# Patient Record
Sex: Female | Born: 1970 | Race: White | Hispanic: No | Marital: Married | Smoking: Former smoker
Health system: Southern US, Community
[De-identification: ages and names within clinical notes are randomized; demographics above are authoritative.]

## PROBLEM LIST (undated history)

## (undated) DIAGNOSIS — E079 Disorder of thyroid, unspecified: Secondary | ICD-10-CM

## (undated) DIAGNOSIS — J189 Pneumonia, unspecified organism: Secondary | ICD-10-CM

## (undated) DIAGNOSIS — L509 Urticaria, unspecified: Secondary | ICD-10-CM

## (undated) DIAGNOSIS — I272 Pulmonary hypertension, unspecified: Secondary | ICD-10-CM

## (undated) HISTORY — DX: Pulmonary hypertension, unspecified: I27.20

## (undated) HISTORY — DX: Pneumonia, unspecified organism: J18.9

## (undated) HISTORY — DX: Disorder of thyroid, unspecified: E07.9

## (undated) HISTORY — DX: Urticaria, unspecified: L50.9

## (undated) HISTORY — PX: TONSILLECTOMY: SUR1361

## (undated) HISTORY — PX: ABDOMINAL HYSTERECTOMY: SHX81

---

## 2014-01-18 ENCOUNTER — Ambulatory Visit (INDEPENDENT_AMBULATORY_CARE_PROVIDER_SITE_OTHER): Payer: BC Managed Care – PPO

## 2014-01-18 VITALS — BP 135/84 | HR 96 | Resp 12

## 2014-01-18 DIAGNOSIS — S9031XA Contusion of right foot, initial encounter: Secondary | ICD-10-CM

## 2014-01-18 DIAGNOSIS — R52 Pain, unspecified: Secondary | ICD-10-CM

## 2014-01-18 DIAGNOSIS — G5791 Unspecified mononeuropathy of right lower limb: Secondary | ICD-10-CM

## 2014-01-18 MED ORDER — MELOXICAM 15 MG PO TABS: 15.0000 mg | ORAL_TABLET | Freq: Every day | ORAL | Status: DC

## 2014-01-18 NOTE — Patient Instructions (Signed)
ICE INSTRUCTIONS  Apply ice or cold pack to the affected area at least 3 times a day for 10-15 minutes each time.  You should also use ice after prolonged activity or vigorous exercise.  Do not apply ice longer than 20 minutes at one time.  Always keep a cloth between your skin and the ice pack to prevent burns.  Being consistent and following these instructions will help control your symptoms.  We suggest you purchase a gel ice pack because they are reusable and do bit leak.  Some of them are designed to wrap around the area.  Use the method that works best for you.  Here are some other suggestions for icing.   Use a frozen bag of peas or corn-inexpensive and molds well to your body, usually stays frozen for 10 to 20 minutes.  Wet a towel with cold water and squeeze out the excess until it's damp.  Place in a bag in the freezer for 20 minutes. Then remove and use.  Alternate applying a hot compress such as a warm hot moist washcloth for 10 minutes, then apply ice pack for 10 minutes. Repeat this alternating hot and cold therapy 2 or 3 times, at least 2 or 3 times every day. Maintain Darco or loosefitting shoe for at least another week. Followup if no improvement within the next 2-4 weeks

## 2014-01-18 NOTE — Progress Notes (Signed)
   Subjective:    Patient ID: Stephan Ministermy Bristol, female    DOB: 12/30/1970, 43 y.o.   MRN: [id number]  HPI PT STATED INJURED RT FOOT 1 WEEK AGO AND STILL PAINFUL. FOOT IS NOT GETTING WORSE BUT FELT HAVE SWELLING, BURNING, AND NUMBNESS FEELING. THE FOOT GET AGGRAVATED BY WALKING. TRIED WEARING DARCO SHOES.   Review of Systems  HENT: Positive for hearing loss.   All other systems reviewed and are negative.      Objective:   Physical Exam 43 year old white female well-developed well-nourished oriented x3 indication dropped a piece of countertop approximately 20 pounds weight on the top of her foot just around the metatarsophalangeal joint area great toe and across the entire forefoot. It are extremely initially however continues to be painful describes a sharp tissue sensations burning sensation the bottom of the football foot under the metatarsal area first also some tenderness along the dorsal foot from second to fourth metatarsal neck areas. There is no edema no ecchymosis noted the lunate and worse initially although this not significant at this time. No open wounds no ulcers no lacerations noted  Lower extremity objective findings reveal vascular status to be intact pedal pulses palpable DP and PT +2/4 capillary refill time 3 seconds epicritic and proprioceptive sensations intact patient may have some hyperesthesia on palpation and percussion over the dorsum of the foot and some tenderness in the plantar foot first MTP area although no acute edema no induration the joints is identified x-rays reveal no fracture no other osseous abnormality no cyst or tumors no spurs are identified. From a biomechanical standpoint motor functions are intact and symmetric bilateral has good dorsal flexion plantar flexion inversion eversion digital motions are intact and symmetric some tenderness on the dorsal flexion the hallux is noted. Remainder of exam unremarkable rectus digits otherwise identified rectus foot  noted       Assessment & Plan:  Assessment this time contusion dorsum of the foot with possibly subsequent neuropraxia or neuritis. The toes appear to be intact although cannot rule out some mild tendinopathy however in general more likely with I nerve type symptomology posse swelling and nerve compression her contusion of the nerve. Plan at this time [facility] given remanded alternating hot and cold therapy is making Darco shoe. The week and then resume other shoes and activities. If not resolved within the next 3 or 4 weeks followup for reevaluation may be consider other noninvasive studies to assess soft tissue injury or trauma that is not visible on plain x-ray. Again hot and cold therapy as recommended and MOBIC 15 mg once daily next  Alvan Dameichard Tamora Huneke DPM

## 2017-09-19 ENCOUNTER — Ambulatory Visit (INDEPENDENT_AMBULATORY_CARE_PROVIDER_SITE_OTHER): Payer: BLUE CROSS/BLUE SHIELD | Admitting: Pulmonary Disease

## 2017-09-19 ENCOUNTER — Encounter (INDEPENDENT_AMBULATORY_CARE_PROVIDER_SITE_OTHER): Payer: Self-pay

## 2017-09-19 ENCOUNTER — Other Ambulatory Visit: Payer: Self-pay | Admitting: Pulmonary Disease

## 2017-09-19 ENCOUNTER — Encounter: Payer: Self-pay | Admitting: Pulmonary Disease

## 2017-09-19 VITALS — BP 122/76 | HR 93 | Ht 66.0 in | Wt 213.8 lb

## 2017-09-19 DIAGNOSIS — J189 Pneumonia, unspecified organism: Secondary | ICD-10-CM | POA: Diagnosis not present

## 2017-09-19 DIAGNOSIS — R0600 Dyspnea, unspecified: Secondary | ICD-10-CM

## 2017-09-19 DIAGNOSIS — J181 Lobar pneumonia, unspecified organism: Secondary | ICD-10-CM

## 2017-09-19 NOTE — Progress Notes (Deleted)
   Subjective:    Patient ID: Kaitlyn Walker, female    DOB: 12/11/1970, 47 y.o.   MRN: [id number]  HPI    Review of Systems  Constitutional: Positive for fatigue.  HENT: Positive for congestion.   Respiratory: Positive for cough, chest tightness and shortness of breath.   Cardiovascular: Positive for chest pain.       Objective:   Physical Exam        Assessment & Plan:

## 2017-09-19 NOTE — Progress Notes (Signed)
Synopsis: Referred in June 2019 for an abnormal CT chest after a bad case of pneumonia.  Subjective:   PATIENT ID: Kaitlyn MinisterAmy Swiech GENDER: female DOB: 04/27/1970, MRN: [id number]   HPI  Chief Complaint  Patient presents with  . pulmonary consult    referred by Loann QuillNatalie Smith, NP for pneumonia     Zawadi was diagnosed with influenza and strep throat based on throat and nose swabs on April 28.  She says taht a few days later she had worsening chest pain and tenderndess.  She had a hard time breathing at that time and she was sent to [facility] and was hospitalized for 5 days.  She says that the repeat flu test (performed twice at the hospital) were negative.  She received antibiotics and was discharged.  She says that she made very little improvement and she has been seen frequently over the last few weeks and has had repeated CXR's and CT scanning performed by her PCP.  No bronchiits in the last few years.    She now feels tired and is exhausted with showring and getting dressed.  She is still coughing up yellow mucus from time to time.  No fever or chills.    She says that she has had pneumonia off and on for most of her life.  This has occurred every 5-10 years for her entire life.  The last case was 16 years ago.  No childhood asthma.  Her daughters have asthma.  She has six children.  She used to smoke.  She says that she smoked off and on for 20 years, but would quit for extended times like when pregnant.  She says that she smoked no more than 1/4 pack per day.  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  She says there is not too much dust or chemicals.   She says that while hospitalized she had the sudden onset of redness and swelling of her right lower extremity about 3 days during the hospitalization.  She does not recall taking blood thinners.  She said she did not have an ultrasound to evaluate.  Past Medical History:  Diagnosis Date  . Thyroid disease      History reviewed. No  pertinent family history.   Social History   Socioeconomic History  . Marital status: Married    Spouse name: Not on file  . Number of children: Not on file  . Years of education: Not on file  . Highest education level: Not on file  Occupational History  . Not on file  Social Needs  . Financial resource strain: Not on file  . Food insecurity:    Worry: Not on file    Inability: Not on file  . Transportation needs:    Medical: Not on file    Non-medical: Not on file  Tobacco Use  . Smoking status: Former Smoker    Packs/day: 0.00    Years: 20.00    Pack years: 0.00    Types: Cigarettes    Last attempt to quit: 2009    Years since quitting: 10.4  . Smokeless tobacco: Never Used  Substance and Sexual Activity  . Alcohol use: No  . Drug use: No  . Sexual activity: Not on file  Lifestyle  . Physical activity:    Days per week: Not on file    Minutes per session: Not on file  . Stress: Not on file  Relationships  . Social connections:    Talks  on phone: Not on file    Gets together: Not on file    Attends religious service: Not on file    Active member of club or organization: Not on file    Attends meetings of clubs or organizations: Not on file    Relationship status: Not on file  . Intimate partner violence:    Fear of current or ex partner: Not on file    Emotionally abused: Not on file    Physically abused: Not on file    Forced sexual activity: Not on file  Other Topics Concern  . Not on file  Social History Narrative  . Not on file     Allergies  Allergen Reactions  . Codeine      Outpatient Medications Prior to Visit  Medication Sig Dispense Refill  . cyanocobalamin 1000 MCG tablet Take 1,000 mcg by mouth daily.    Marland Kitchen levothyroxine (SYNTHROID, LEVOTHROID) 200 MCG tablet Take 150 mcg by mouth daily before breakfast.     . loratadine (CLARITIN) 10 MG tablet Take 10 mg by mouth daily.    . meloxicam (MOBIC) 15 MG tablet Take 1 tablet (15 mg total) by  mouth daily. 30 tablet 1  . oxyCODONE (OXY IR/ROXICODONE) 5 MG immediate release tablet      No facility-administered medications prior to visit.     Review of Systems  Constitutional: Positive for malaise/fatigue. Negative for chills, fever and weight loss.  HENT: Negative for congestion, nosebleeds, sinus pain and sore throat.   Eyes: Negative for photophobia, pain and discharge.  Respiratory: Positive for cough, sputum production and shortness of breath. Negative for hemoptysis and wheezing.   Cardiovascular: Negative for chest pain, palpitations, orthopnea and leg swelling.  Gastrointestinal: Negative for abdominal pain, constipation, diarrhea, nausea and vomiting.  Genitourinary: Negative for dysuria, frequency, hematuria and urgency.  Musculoskeletal: Negative for back pain, joint pain, myalgias and neck pain.  Skin: Negative for itching and rash.  Neurological: Negative for tingling, tremors, sensory change, speech change, focal weakness, seizures, weakness and headaches.  Psychiatric/Behavioral: Negative for memory loss, substance abuse and suicidal ideas. The patient is not nervous/anxious.       Objective:  Physical Exam   Vitals:   09/19/17 1503  BP: 122/76  Pulse: 93  SpO2: 97%  Weight: 213 lb 12.8 oz (97 kg)  Height: 5\' 6"  (1.676 m)   RA  Gen: well appearing, no acute distress HENT: NCAT, OP clear, neck supple without masses Eyes: PERRL, EOMi Lymph: no cervical lymphadenopathy PULM: CTA B CV: RRR, no mgr, no JVD GI: BS+, soft, nontender, no hsm Derm: no rash or skin breakdown MSK: normal bulk and tone Neuro: A&Ox4, CN II-XII intact, strength 5/5 in all 4 extremities Psyche: normal mood and affect   CBC No results found for: WBC, RBC, HGB, HCT, PLT, MCV, MCH, MCHC, RDW, LYMPHSABS, MONOABS, EOSABS, BASOSABS   Chest imaging: Aug 19, 2017 chest x-ray images independently reviewed showing significant consolidation in the left lung, specifically left lower  lobe, some patchy consolidation in the right Sep 13, 2017 CT chest images independently reviewed showing patchy consolidation and groundglass in the upper lobes bilaterally left greater than right, some mucus plugging in the left lower lobe PFT:  Labs:  Path:  Echo:  Heart Catheterization:       Assessment & Plan:   Dyspnea, unspecified type - Plan: ECHOCARDIOGRAM COMPLETE, CBC with Differential/Platelet, IgG, IgM, IgA, IgG Subclasses, C3 and C4  Discussion: Ms. Gainey comes my clinic  today for evaluation of a episode of severe community acquired pneumonia which has been slowly resolving.  Today she describes symptoms which are completely in keeping with the natural history of pneumonia and that she still has some fatigue, shortness of breath and cough with mucus production.  Given the severity of consolidation on the chest x-ray I am not surprised that she still feels this way.  I told her today that if she were to have worsening fever or worsening shortness of breath that would be a problem.  I am a bit concerned about her ongoing shortness of breath however considering the fact that she says that she had the onset of redness and swelling of her foot while hospitalized and she says she does not recall receiving any shots or other medicines for blood thinning.  This concerns me about the possibility of pulmonary embolism.  I think the best approach at this point is to check an echocardiogram to look for evidence of pulmonary hypertension.  In addition, she describes several episodes of pneumonia over her life.  I do not see evidence of structural lung disease on the CT scan of her chest and I do not think there is any other lung problem.  However, I do question whether or not she could have some sort of immunodeficiency.  Plan:  For recurrent episodes of pneumonia: We will check blood work today to look for evidence of a problem with your immune system: Complete blood count with  differential, serum immunoglobulin levels, serum IgG sub-panel levels, complement levels.  For your recent severe community-acquired pneumonia: The symptoms you are describing are completely in keeping with the natural progression of pneumonia.  However, it would not be normal for you to have a new fever or worsening shortness of breath.  If you have that problem he should call us right away. We need to make sure that the pneumonia gets better so I when she to come back in 3 to 4 weeks to have another chest x-ray. I anticipate that it will be difficult for you to return to work for another 4 to 6 weeks however so were happy to fill out paperwork for your employer.  For the ongoing shortness of breath: Because she described leg pain and swelling during the hospitalization I am going to check an echocardiogram to make sure there is no evidence of a condition called pulmonary hypertension which can develop after blood clots.  We will see you back in 3 to 4 weeks with a chest x-ray.   Current Outpatient Medications:  .  cyanocobalamin 1000 MCG tablet, Take 1,000 mcg by mouth daily., Disp: , Rfl:  .  levothyroxine (SYNTHROID, LEVOTHROID) 200 MCG tablet, Take 150 mcg by mouth daily before breakfast. , Disp: , Rfl:

## 2017-09-19 NOTE — Patient Instructions (Signed)
For recurrent episodes of pneumonia: We will check blood work today to look for evidence of a problem with your immune system: Complete blood count with differential, serum immunoglobulin levels, serum IgG sub-panel levels, complement levels.  For your recent severe community-acquired pneumonia: The symptoms you are describing are completely in keeping with the natural progression of pneumonia.  However, it would not be normal for you to have a new fever or worsening shortness of breath.  If you have that problem he should call us right away. We need to make sure that the pneumonia gets better so I when she to come back in 3 to 4 weeks to have another chest x-ray. I anticipate that it will be difficult for you to return to work for another 4 to 6 weeks however so were happy to fill out paperwork for your employer.  For the ongoing shortness of breath: Because she described leg pain and swelling during the hospitalization I am going to check an echocardiogram to make sure there is no evidence of a condition called pulmonary hypertension which can develop after blood clots.  We will see you back in 3 to 4 weeks with a chest x-ray.

## 2017-09-20 LAB — CBC WITH DIFFERENTIAL/PLATELET
Basophils Absolute: 0 10*3/uL (ref 0.0–0.2)
Basos: 0 %
EOS (ABSOLUTE): 0.3 10*3/uL (ref 0.0–0.4)
Eos: 3 %
Hematocrit: 38.6 % (ref 34.0–46.6)
Hemoglobin: 12.6 g/dL (ref 11.1–15.9)
Immature Grans (Abs): 0 10*3/uL (ref 0.0–0.1)
Immature Granulocytes: 0 %
LYMPHS ABS: 2.3 10*3/uL (ref 0.7–3.1)
LYMPHS: 22 %
MCH: 28 pg (ref 26.6–33.0)
MCHC: 32.6 g/dL (ref 31.5–35.7)
MCV: 86 fL (ref 79–97)
Monocytes Absolute: 0.9 10*3/uL (ref 0.1–0.9)
Monocytes: 9 %
Neutrophils Absolute: 6.7 10*3/uL (ref 1.4–7.0)
Neutrophils: 66 %
PLATELETS: 240 10*3/uL (ref 150–450)
RBC: 4.5 x10E6/uL (ref 3.77–5.28)
RDW: 15.5 % — ABNORMAL HIGH (ref 12.3–15.4)
WBC: 10.3 10*3/uL (ref 3.4–10.8)

## 2017-09-20 LAB — C3 AND C4
Complement C3, Serum: 165 mg/dL (ref 82–167)
Complement C4, Serum: 29 mg/dL (ref 14–44)

## 2017-09-20 LAB — IGG: IGG (IMMUNOGLOBIN G), SERUM: 1015 mg/dL (ref 700–1600)

## 2017-09-20 LAB — IGM: IgM (Immunoglobulin M), Srm: 63 mg/dL (ref 26–217)

## 2017-09-20 LAB — SPECIMEN STATUS REPORT

## 2017-09-20 LAB — IGA: IGA/IMMUNOGLOBULIN A, SERUM: 254 mg/dL (ref 87–352)

## 2017-09-20 NOTE — Progress Notes (Signed)
LMTCB x1 on preferred phone number listed for patient.  

## 2017-09-21 ENCOUNTER — Telehealth: Payer: Self-pay | Admitting: Pulmonary Disease

## 2017-09-21 LAB — IGG 1, 2, 3, AND 4
IGG 4: 11 mg/dL (ref 2–96)
IgG (Immunoglobin G), Serum: 981 mg/dL (ref 700–1600)
IgG, Subclass 1: 549 mg/dL (ref 248–810)
IgG, Subclass 2: 302 mg/dL (ref 130–555)
IgG, Subclass 3: 82 mg/dL (ref 15–102)

## 2017-09-21 LAB — SPECIMEN STATUS REPORT

## 2017-09-21 NOTE — Telephone Encounter (Signed)
Advised pt of results. Pt understood and nothing further is needed.     Notes recorded by Lupita LeashMcQuaid, Douglas B, MD on 09/20/2017 at 11:43 AM EDT BJ, Please let the patient know this was OK Thanks, B

## 2017-09-22 ENCOUNTER — Telehealth: Payer: Self-pay | Admitting: Pulmonary Disease

## 2017-09-22 NOTE — Telephone Encounter (Signed)
Forms have been handed to [employer].   Will route to Merrilee SeashoreBettie Jo to f/u on.

## 2017-09-23 ENCOUNTER — Ambulatory Visit (HOSPITAL_COMMUNITY): Payer: BLUE CROSS/BLUE SHIELD | Attending: Internal Medicine

## 2017-09-23 ENCOUNTER — Other Ambulatory Visit: Payer: Self-pay

## 2017-09-23 ENCOUNTER — Other Ambulatory Visit (HOSPITAL_COMMUNITY): Payer: BLUE CROSS/BLUE SHIELD

## 2017-09-23 DIAGNOSIS — J189 Pneumonia, unspecified organism: Secondary | ICD-10-CM | POA: Diagnosis not present

## 2017-09-23 DIAGNOSIS — R0600 Dyspnea, unspecified: Secondary | ICD-10-CM | POA: Diagnosis not present

## 2017-09-23 DIAGNOSIS — Z87891 Personal history of nicotine dependence: Secondary | ICD-10-CM | POA: Diagnosis not present

## 2017-09-23 DIAGNOSIS — I272 Pulmonary hypertension, unspecified: Secondary | ICD-10-CM | POA: Diagnosis not present

## 2017-09-23 NOTE — Telephone Encounter (Signed)
Dr. Kendrick FriesMcQuaid has complete forms and it has been faxed. Nothing further needed.

## 2017-09-27 ENCOUNTER — Other Ambulatory Visit: Payer: Self-pay | Admitting: Pulmonary Disease

## 2017-09-27 ENCOUNTER — Ambulatory Visit (INDEPENDENT_AMBULATORY_CARE_PROVIDER_SITE_OTHER)
Admission: RE | Admit: 2017-09-27 | Discharge: 2017-09-27 | Disposition: A | Discharge status: Home / Self Care | Payer: BLUE CROSS/BLUE SHIELD | Source: Ambulatory Visit | Attending: Pulmonary Disease | Admitting: Pulmonary Disease

## 2017-09-27 DIAGNOSIS — J189 Pneumonia, unspecified organism: Secondary | ICD-10-CM

## 2017-09-27 MED ORDER — IOPAMIDOL (ISOVUE-370) INJECTION 76%
80.0000 mL | Freq: Once | INTRAVENOUS | Status: AC | PRN
  Administered 2017-09-27: 80 mL via INTRAVENOUS

## 2017-10-03 ENCOUNTER — Telehealth: Payer: Self-pay | Admitting: Pulmonary Disease

## 2017-10-03 NOTE — Telephone Encounter (Signed)
Spoke with pt. She is needing her FMLA paperwork updated. On the form Dr. Kendrick FriesMcQuaid wrote for the pt to go back to on 10/03/17. Pt states that she was told by Dr. Kendrick FriesMcQuaid that she would be out of work for 4-6 weeks. She is wanting this to be reflected on her forms. We no longer have the original forms due to them being sent to be scanned. Pt is going to have her employer fax over another copy. This going to be coming to [employer]'s attention. Will hold in triage until it's received.

## 2017-10-04 NOTE — Telephone Encounter (Signed)
Pt is calling back about FMLA paperwork [phone]

## 2017-10-04 NOTE — Telephone Encounter (Signed)
Called and spoke with patient, she wanted to know if we have received the papers from her work yet. Checked with bettie jo, checked BQ cubby and folder. Advised patient that we have not received the paperwork. She states that she will call her work tomorrow about this.

## 2017-10-05 NOTE — Telephone Encounter (Signed)
Called and spoke with pt to see if the FMLA paperwork had been sent to our office and she stated her work was supposed to be faxing our office paperwork that needs to be filled out for [employer].  Pt stated corporate could have a statement stating when pt's real estimate return back to will be and that should good enough.  Pt does have an OV scheduled tomorrow, 6[facility]20 [facility] and said if she needed to bring anything with her regarding getting the FMLA taken care of, she would.  Patrice, please advise if you have received a fax regarding FMLA paperwork on pt.

## 2017-10-05 NOTE — Telephone Encounter (Signed)
Called and spoke with patient, she states that her work told her if they did not have a statement from the FMLA today regarding her dates of return then she would not get paid. Patient stated that she was going to bring paperwork up this afternoon to be filled out by BQ to return to her work today. Advised patient that BQ was not in the office today and he would not be able to fill it out. She is wanting to know what to do next. She is aware that BQ is not in office until next Monday and even then he is in [place].   BQ please advise, thank you.

## 2017-10-05 NOTE — Telephone Encounter (Signed)
If you put it on my desk this afternoon I'll try to fill it out tonight

## 2017-10-05 NOTE — Telephone Encounter (Signed)
Pt has question's about the FML papers    [phone]

## 2017-10-06 ENCOUNTER — Encounter: Payer: Self-pay | Admitting: Adult Health

## 2017-10-06 ENCOUNTER — Ambulatory Visit (HOSPITAL_BASED_OUTPATIENT_CLINIC_OR_DEPARTMENT_OTHER)
Admission: RE | Admit: 2017-10-06 | Discharge: 2017-10-06 | Disposition: A | Discharge status: Home / Self Care | Payer: BLUE CROSS/BLUE SHIELD | Source: Ambulatory Visit | Attending: Adult Health | Admitting: Adult Health

## 2017-10-06 ENCOUNTER — Ambulatory Visit (INDEPENDENT_AMBULATORY_CARE_PROVIDER_SITE_OTHER): Payer: BLUE CROSS/BLUE SHIELD | Admitting: Adult Health

## 2017-10-06 VITALS — BP 129/80 | HR 87 | Ht 66.0 in | Wt 213.0 lb

## 2017-10-06 DIAGNOSIS — R918 Other nonspecific abnormal finding of lung field: Secondary | ICD-10-CM | POA: Insufficient documentation

## 2017-10-06 DIAGNOSIS — J189 Pneumonia, unspecified organism: Secondary | ICD-10-CM | POA: Insufficient documentation

## 2017-10-06 NOTE — Progress Notes (Signed)
@Patient  ID: Kaitlyn Walker, female    DOB: 1971-03-15, 47 y.o.   MRN: [id number]  Chief Complaint  Patient presents with  . 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   TEST  Aug 19, 2017 chest x-ray images independently reviewed showing significant consolidation in the left lung, specifically left lower lobe, some patchy consolidation in the right Sep 13, 2017 CT chest images independently reviewed showing patchy consolidation and groundglass in the upper lobes bilaterally left greater than right, some mucus plugging in the left lower lobe   10/06/2017 Follow up : PNA  Patient returns for a 2-week follow-up.  Patient was diagnosed with influenza and strep throat in April 2019 she continued to have persistent cough and congestion was found to have bilateral pneumonia.  She ended up requiring hospitalization and treatment for pneumonia.  Patient had a had lingering cough and shortness of breath. Patient was set up for an 2D echo that showed normal ejection and at 60-65%, grade 1 diastolic dysfunction, mild pulmonary artery hypertension pulmonary pressure at 30 mmHg.  Due to the mild pulmonary artery hypertension patient was set up for a CT chest angios that was negative for PE.  There was some patchy infiltrates in the upper lobe that showed improvement with decreasing density. Labs showed normal CBC, IgA, IgM and IgG were normal Last visit patient is feeling some better, still weak with low energy . Cough is decreasing .  Chest x-ray today shows ongoing interval  improvement in the bilateral upper lobe airspace densities.  There is only a subtle residual opacity now seen on the left.  Denies any chest pain, orthopnea, fever. Spirometry today shows normal lung function.  Allergies  Allergen Reactions  . Codeine      There is no immunization history on file for this patient.  Past Medical History:  Diagnosis Date  . Thyroid disease     Tobacco History: Social History   Tobacco Use  Smoking Status Former Smoker  . Packs/day: 0.00  . Years: 20.00  . Pack years: 0.00  . Types: Cigarettes  . Last attempt to quit: 2009  . Years since quitting: 10.4  Smokeless Tobacco Never Used   Counseling given: Not Answered   Outpatient Encounter Medications as of 10/06/2017  Medication Sig  . cyanocobalamin 1000 MCG tablet Take 1,000 mcg by mouth daily.  Marland Kitchen levothyroxine (SYNTHROID, LEVOTHROID) 200 MCG tablet Take 150 mcg by mouth daily before breakfast.    No facility-administered encounter medications on file as of 10/06/2017.      Review of Systems  Constitutional:   No  weight loss, night sweats,  Fevers, chills,  +fatigue, or  lassitude.  HEENT:   No headaches,  Difficulty swallowing,  Tooth/dental problems, or  Sore throat,                No sneezing, itching, ear ache, nasal congestion, post nasal drip,   CV:  No chest pain,  Orthopnea, PND, swelling in lower extremities, anasarca, dizziness, palpitations, syncope.   GI  No heartburn, indigestion, abdominal pain, nausea, vomiting, diarrhea,  change in bowel habits, loss of appetite, bloody stools.   Resp:    No chest wall deformity  Skin: no rash or lesions.  GU: no dysuria, change in color of urine, no urgency or frequency.  No flank pain, no hematuria   MS:  No joint pain or swelling.  No decreased range of motion.  No back pain.    Physical Exam  BP 129/80 (BP Location: Left Arm, Cuff Size: Normal)   Pulse 87   Ht 5\' 6"  (1.676 m)   Wt 213 lb (96.6 kg)   LMP 10/05/2017    SpO2 96%   BMI 34.38 kg/m   GEN: A/Ox3; pleasant , NAD, well nourished    HEENT:  [place]/AT,  EACs-clear, TMs-wnl, NOSE-clear, THROAT-clear, no lesions, no postnasal drip or exudate noted.   NECK:  Supple w/ fair ROM; no JVD; normal carotid impulses w/o bruits; no thyromegaly or nodules palpated; no lymphadenopathy.    RESP  Clear  P & A; w/o, wheezes/ rales/ or rhonchi. no accessory muscle use, no dullness to percussion  CARD:  RRR, no m/r/g, no peripheral edema, pulses intact, no cyanosis or clubbing.  GI:   Soft & nt; nml bowel sounds; no organomegaly or masses detected.   Musco: Warm bil, no deformities or joint swelling noted.   Neuro: alert, no focal deficits noted.    Skin: Warm, no lesions or rashes    Lab Results:  CBC    Component Value Date/Time   WBC 10.3 09/19/2017 0000   RBC 4.50 09/19/2017 0000   HGB 12.6 09/19/2017 0000   HCT 38.6 09/19/2017 0000   PLT 240 09/19/2017 0000   MCV 86 09/19/2017 0000   MCH 28.0 09/19/2017 0000   MCHC 32.6 09/19/2017 0000   RDW 15.5 (H) 09/19/2017 0000   LYMPHSABS 2.3 09/19/2017 0000   EOSABS 0.3 09/19/2017 0000   BASOSABS 0.0 09/19/2017 0000    BMET No results found for: NA, K, CL, CO2, GLUCOSE, BUN, CREATININE, CALCIUM, GFRNONAA, GFRAA  BNP No results found for: BNP  ProBNP No results found for: PROBNP  Imaging: Dg Chest 2 View  Result Date: 10/06/2017 CLINICAL DATA:  Follow-up examination for pneumonia. EXAM: CHEST - 2 VIEW COMPARISON:  Prior radiographs from 09/05/2017 as well as previous studies dating back to 08/19/2017. FINDINGS: Cardiac and mediastinal silhouette stable in size and contour, and remain within normal limits. Lungs are normally inflated. Previously seen patchy infiltrates involving the bilateral upper lobes have improved in the interim, with only mild residual hazy infiltrate present within the left upper lobe. Right lung opacities have essentially resolved. Underlying bilateral perihilar scarring  noted. No new focal infiltrates. No pulmonary edema or pleural effusion. No pneumothorax. Osseous structures are unchanged. IMPRESSION: 1. Continued interval improvement in previously seen bilateral upper lobe airspace opacities, with only subtle residual opacity now seen on the left. 2. No other new active cardiopulmonary disease. Electronically Signed   By: Rise Mu M.D.   On: 10/06/2017 15:35   Ct Angio Chest W/cm &/or Wo Cm  Result Date: 09/27/2017 CLINICAL DATA:  Shortness of breath following pneumonia in April. Bilateral leg swelling. Assess for pulmonary emboli. EXAM: CT ANGIOGRAPHY CHEST WITH CONTRAST TECHNIQUE: Multidetector CT imaging of the chest was performed using the standard protocol during bolus administration of intravenous contrast. Multiplanar CT image reconstructions and MIPs were obtained to evaluate the vascular anatomy. CONTRAST:  80mL ISOVUE-370 IOPAMIDOL (ISOVUE-370) INJECTION 76% COMPARISON:  09/13/2017 FINDINGS: Cardiovascular: Pulmonary arterial opacification is good. There  are no pulmonary emboli. The aorta is normal. Question early coronary artery calcification, not definite. Heart size is normal. No pericardial fluid. Mediastinum/Nodes: No mass or lymphadenopathy. Lungs/Pleura: No pleural effusion. Minimal atelectasis in the lingula. Patchy infiltrates previously seen throughout the upper lobes are becoming less dense. No apparent new or progressive finding. Upper Abdomen: Liver cyst as chronically noted. Musculoskeletal: Negative Review of the MIP images confirms the above findings. IMPRESSION: No pulmonary emboli. Redemonstration of patchy infiltrates in the upper lobes, showing some changes improvement with diminishing density. No new or progressive findings. Electronically Signed   By: Paulina FusiMark  Shogry M.D.   On: 09/27/2017 15:11     Assessment & Plan:   CAP (community acquired pneumonia) CAP - slowly improving clinically . Spirometry is normal  Workup shows  gradual clearnace on CT and chest xray  Advance act as tolerated.  Immune sx labs okay  Fh of Alpha 1 , check labs today .  Work note to return on 10/30/17 .       Rubye Oaksammy Kensy Blizard, NP 10/06/2017

## 2017-10-06 NOTE — Patient Instructions (Signed)
Advance activity as tolerated.  Labs today .  Follow up with Dr. Kendrick FriesMcQuaid in 6-8 weeks and As needed

## 2017-10-06 NOTE — Assessment & Plan Note (Signed)
CAP - slowly improving clinically . Spirometry is normal  Workup shows gradual clearnace on CT and chest xray  Advance act as tolerated.  Immune sx labs okay  Fh of Alpha 1 , check labs today .  Work note to return on 10/30/17 .

## 2017-10-07 NOTE — Telephone Encounter (Signed)
Work note was written by TP at pt's OV 10/06/17.  Will close encounter.

## 2017-10-08 NOTE — Progress Notes (Signed)
Reviewed, agree 

## 2017-10-10 LAB — ALPHA-1 ANTITRYPSIN PHENOTYPE: A1 ANTITRYPSIN: 130 mg/dL (ref 90–200)

## 2017-10-11 NOTE — Progress Notes (Signed)
Called spoke with patient, advised of lab results / recs as stated by TP.  Pt verbalized understanding and denied any questions. 

## 2017-10-25 ENCOUNTER — Telehealth: Payer: Self-pay | Admitting: Pulmonary Disease

## 2017-10-25 NOTE — Telephone Encounter (Signed)
Received the form from pt that needs to be signed by BQ.  Gave form to BQ and stated to him that pt was out in the lobby waiting.  BQ stated he would sign it when he could due to currently seeing pts.  Went out to the lobby to make pt aware that the form was handed to BQ but that he was currently seeing pts and would sign it when he could.  Pt expressed understanding. Will route to Merrilee SeashoreBettie Jo for her to follow up on once form has been signed for pt.

## 2017-10-25 NOTE — Telephone Encounter (Signed)
Dr. Kendrick FriesMcQuaid signed form and it was personally handed to the patient by me. Nothing further needed.

## 2017-11-22 ENCOUNTER — Encounter: Payer: Self-pay | Admitting: Pulmonary Disease

## 2017-11-22 ENCOUNTER — Ambulatory Visit (HOSPITAL_BASED_OUTPATIENT_CLINIC_OR_DEPARTMENT_OTHER)
Admission: RE | Admit: 2017-11-22 | Discharge: 2017-11-22 | Disposition: A | Discharge status: Home / Self Care | Payer: BLUE CROSS/BLUE SHIELD | Source: Ambulatory Visit | Attending: Pulmonary Disease | Admitting: Pulmonary Disease

## 2017-11-22 ENCOUNTER — Ambulatory Visit (INDEPENDENT_AMBULATORY_CARE_PROVIDER_SITE_OTHER): Payer: BLUE CROSS/BLUE SHIELD | Admitting: Pulmonary Disease

## 2017-11-22 VITALS — BP 122/78 | HR 79 | Ht 66.0 in | Wt 215.0 lb

## 2017-11-22 DIAGNOSIS — J189 Pneumonia, unspecified organism: Secondary | ICD-10-CM

## 2017-11-22 NOTE — Progress Notes (Signed)
Synopsis: Referred in June 2019 for an abnormal CT chest after a bad case of pneumonia.  Subjective:   PATIENT ID: Kaitlyn Walker GENDER: female DOB: 04/16/1971, MRN: [id number]   HPI  Chief Complaint  Patient presents with  . Follow-up    pneumonia    She has been feeling better. She says that her cough has improved.  She says that she is starting to feel better and has increased her exercise tolerance.  She is now walking some minutes going well.  She is not coughing up any mucus.  She says she does not quite feel like herself but she is feeling better than earlier in the year.  She has gone back to work and is going okay.  Past Medical History:  Diagnosis Date  . Thyroid disease       Review of Systems  Constitutional: Positive for malaise/fatigue. Negative for chills, fever and weight loss.  HENT: Negative for congestion, nosebleeds, sinus pain and sore throat.   Eyes: Negative for photophobia, pain and discharge.  Respiratory: Positive for cough, sputum production and shortness of breath. Negative for hemoptysis and wheezing.   Cardiovascular: Negative for chest pain, palpitations, orthopnea and leg swelling.  Gastrointestinal: Negative for abdominal pain, constipation, diarrhea, nausea and vomiting.  Genitourinary: Negative for dysuria, frequency, hematuria and urgency.  Musculoskeletal: Negative for back pain, joint pain, myalgias and neck pain.  Skin: Negative for itching and rash.  Neurological: Negative for tingling, tremors, sensory change, speech change, focal weakness, seizures, weakness and headaches.  Psychiatric/Behavioral: Negative for memory loss, substance abuse and suicidal ideas. The patient is not nervous/anxious.       Objective:  Physical Exam   Vitals:   11/22/17 0844  BP: 122/78  Pulse: 79  SpO2: 97%  Weight: 215 lb 0.6 oz (97.5 kg)  Height: 5\' 6"  (1.676 m)   RA  Gen: well appearing HENT: OP clear, TM's clear, neck supple PULM: CTA B,  normal percussion CV: RRR, no mgr, trace edema GI: BS+, soft, nontender Derm: no cyanosis or rash Psyche: normal mood and affect    CBC    Component Value Date/Time   WBC 10.3 09/19/2017 0000   RBC 4.50 09/19/2017 0000   HGB 12.6 09/19/2017 0000   HCT 38.6 09/19/2017 0000   PLT 240 09/19/2017 0000   MCV 86 09/19/2017 0000   MCH 28.0 09/19/2017 0000   MCHC 32.6 09/19/2017 0000   RDW 15.5 (H) 09/19/2017 0000   LYMPHSABS 2.3 09/19/2017 0000   EOSABS 0.3 09/19/2017 0000   BASOSABS 0.0 09/19/2017 0000     Chest imaging: Aug 19, 2017 chest x-ray images independently reviewed showing significant consolidation in the left lung, specifically left lower lobe, some patchy consolidation in the right Sep 13, 2017 CT chest images independently reviewed showing patchy consolidation and groundglass in the upper lobes bilaterally left greater than right, some mucus plugging in the left lower lobe 09/2017 CT angiogram: no PE, improved bilateral upper lobe predominant infiltrates, images indepedently reviewed  PFT:  Labs: 2019: IgG subclass within normal limits, immunoglobulin panel within normal limits, alpha-1 antitrypsin within normal limits  Path:  Echo: 09/2017 TTE: LVEF 60-65%, normal wall thickness, normal wall motion, grade 1 DD, indeterminate LV filling pressure, normal biatrial size,   trivial TR, RVSP 30 mmHg, normal IVC.  Heart Catheterization:       Assessment & Plan:   Recurrent pneumonia - Plan: DG Chest 2 View  Discussion: [name] is gradually getting better which is  to be expected with a severe case of pneumonia.  I have not identified an underlying lung comorbid illness or lab work to suggest that she has an immunodeficiency.  I believe she just had a severe case of pneumonia which is now slowly getting better.  There is nothing on imaging to suggest an atypical infection.  We will get a chest x-ray today to make sure that it has completely cleared up.  I have advised her  to follow-up with me on an as-needed basis.  Plan: Severe community acquired pneumonia: Things seem to be getting better We will check a chest x-ray to make sure everything is cleared up Let us know if this happens again, we would be happy to see you again   We will see you back on an as-needed basis   Current Outpatient Medications:  .  levothyroxine (SYNTHROID, LEVOTHROID) 200 MCG tablet, Take 175 mcg by mouth daily before breakfast. , Disp: , Rfl:  .  cyanocobalamin 1000 MCG tablet, Take 1,000 mcg by mouth daily., Disp: , Rfl:

## 2017-11-22 NOTE — Patient Instructions (Signed)
Severe community acquired pneumonia: Things seem to be getting better We will check a chest x-ray to make sure everything is cleared up Let us know if this happens again, we would be happy to see you again   We will see you back on an as-needed basis

## 2018-05-25 ENCOUNTER — Ambulatory Visit (INDEPENDENT_AMBULATORY_CARE_PROVIDER_SITE_OTHER): Payer: BLUE CROSS/BLUE SHIELD | Admitting: Pulmonary Disease

## 2018-05-25 ENCOUNTER — Encounter: Payer: Self-pay | Admitting: Pulmonary Disease

## 2018-05-25 VITALS — BP 110/78 | HR 68 | Ht 66.0 in | Wt 215.0 lb

## 2018-05-25 DIAGNOSIS — G478 Other sleep disorders: Secondary | ICD-10-CM

## 2018-05-25 NOTE — Progress Notes (Signed)
Kaitlyn Walker    [id number]    [birth year]  Primary Care Physician:Grisso, Evlyn Courier., MD  Referring Physician: Gordan Payment., MD [address] [place], [place] [address]  Chief complaint:   Patient being seen for possible obstructive sleep apnea  HPI:  History of snoring No witnessed apneas She usually works night shift Sleep schedule Bedtime 9-10 PM-wakes up between 2 and 3 AM When she works night shift she would usually try to get in bed by 9 AM, wake up time about 3 PM Admits to dryness of her mouth Admits to night sweats Admits to headaches  Mom has obstructive sleep apnea  Has gained about 20 pounds recently She does have a history of hypothyroidism-well-controlled Reformed smoker   Outpatient Encounter Medications as of 05/25/2018  Medication Sig  . cyanocobalamin 1000 MCG tablet Take 1,000 mcg by mouth daily.  Marland Kitchen levothyroxine (SYNTHROID, LEVOTHROID) 200 MCG tablet Take 175 mcg by mouth daily before breakfast.    No facility-administered encounter medications on file as of 05/25/2018.     Allergies as of 05/25/2018 - Review Complete 05/25/2018  Allergen Reaction Noted  . Codeine  01/18/2014    Past Medical History:  Diagnosis Date  . Pneumonia   . Pulmonary hypertension (HCC)   . Thyroid disease     History reviewed. No pertinent surgical history.  Family History  Problem Relation Age of Onset  . Sleep apnea Mother     Social History   Socioeconomic History  . Marital status: Married    Spouse name: Not on file  . Number of children: Not on file  . Years of education: Not on file  . Highest education level: Not on file  Occupational History  . Not on file  Social Needs  . Financial resource strain: Not on file  . Food insecurity:    Worry: Not on file    Inability: Not on file  . Transportation needs:    Medical: Not on file    Non-medical: Not on file  Tobacco Use  . Smoking status: Former Smoker    Packs/day: 0.00    Years:  20.00    Pack years: 0.00    Types: Cigarettes    Last attempt to quit: 2009    Years since quitting: 11.1  . Smokeless tobacco: Never Used  Substance and Sexual Activity  . Alcohol use: No  . Drug use: No  . Sexual activity: Not on file  Lifestyle  . Physical activity:    Days per week: Not on file    Minutes per session: Not on file  . Stress: Not on file  Relationships  . Social connections:    Talks on phone: Not on file    Gets together: Not on file    Attends religious service: Not on file    Active member of club or organization: Not on file    Attends meetings of clubs or organizations: Not on file    Relationship status: Not on file  . Intimate partner violence:    Fear of current or ex partner: Not on file    Emotionally abused: Not on file    Physically abused: Not on file    Forced sexual activity: Not on file  Other Topics Concern  . Not on file  Social History Narrative  . Not on file    Review of Systems  Constitutional: Negative for fatigue.  HENT: Negative.   Eyes: Negative.  Respiratory: Negative for apnea, cough and shortness of breath.   Gastrointestinal: Negative.   Psychiatric/Behavioral: Positive for sleep disturbance.  All other systems reviewed and are negative.   Vitals:   05/25/18 0946  BP: 110/78  Pulse: 68  SpO2: 95%     Physical Exam  Constitutional: She appears well-developed and well-nourished.  HENT:  Head: Normocephalic and atraumatic.  Mallampati 4, crowded oropharynx  Eyes: Pupils are equal, round, and reactive to light. Conjunctivae and EOM are normal. Right eye exhibits no discharge. Left eye exhibits no discharge.  Neck: Normal range of motion. Neck supple. No tracheal deviation present. No thyromegaly present.  thick  Cardiovascular: Normal rate, regular rhythm and normal heart sounds.  No murmur heard. Pulmonary/Chest: Effort normal and breath sounds normal. No respiratory distress. She has no wheezes. She has no  rales. She exhibits no tenderness.  Abdominal: Soft. Bowel sounds are normal. She exhibits no distension. There is no abdominal tenderness.   Results of the Epworth flowsheet 05/25/2018  Sitting and reading 0  Watching TV 1  Sitting, inactive in a public place (e.g. a theatre or a meeting) 1  As a passenger in a car for an hour without a break 0  Lying down to rest in the afternoon when circumstances permit 3  Sitting and talking to someone 0  Sitting quietly after a lunch without alcohol 1  In a car, while stopped for a few minutes in traffic 0  Total score 6    CT scan from June 2019 reviewed-no significant pathology Spirometry 10/06/2017 showed no obstructive defect  Assessment:  .  Moderate probability of significant sleep disordered breathing -Symptoms and physical findings suggest the presence of significant sleep disordered breathing .  Obesity -Increasing weight gain predisposes her to worsening disordered breathing   Plan/Recommendations:  .  Pathophysiology of sleep disordered breathing was discussed with patient  .  Treatment options discussed with patient  .  Importance of weight loss and exercise discussed  .  We will set the patient up for home sleep study  Encouraged to call with any significant concerns   Virl Diamond MD [facility] 05/25/2018, 9:47 AM  CC: Gordan Payment., MD

## 2018-05-25 NOTE — Patient Instructions (Signed)
Moderate probability of significant sleep disordered breathing  We will set you up for home sleep study  I will see you back in the office in about 3 months Call with significant concerns   Sleep Apnea Sleep apnea is a condition in which breathing pauses or becomes shallow during sleep. Episodes of sleep apnea usually last 10 seconds or longer, and they may occur as many as 20 times an hour. Sleep apnea disrupts your sleep and keeps your body from getting the rest that it needs. This condition can increase your risk of certain health problems, including:  Heart attack.  Stroke.  Obesity.  Diabetes.  Heart failure.  Irregular heartbeat. There are three kinds of sleep apnea:  Obstructive sleep apnea. This kind is caused by a blocked or collapsed airway.  Central sleep apnea. This kind happens when the part of the brain that controls breathing does not send the correct signals to the muscles that control breathing.  Mixed sleep apnea. This is a combination of obstructive and central sleep apnea. What are the causes? The most common cause of this condition is a collapsed or blocked airway. An airway can collapse or become blocked if:  Your throat muscles are abnormally relaxed.  Your tongue and tonsils are larger than normal.  You are overweight.  Your airway is smaller than normal. What increases the risk? This condition is more likely to develop in people who:  Are overweight.  Smoke.  Have a smaller than normal airway.  Are elderly.  Are female.  Drink alcohol.  Take sedatives or tranquilizers.  Have a family history of sleep apnea. What are the signs or symptoms? Symptoms of this condition include:  Trouble staying asleep.  Daytime sleepiness and tiredness.  Irritability.  Loud snoring.  Morning headaches.  Trouble concentrating.  Forgetfulness.  Decreased interest in sex.  Unexplained sleepiness.  Mood swings.  Personality  changes.  Feelings of depression.  Waking up often during the night to urinate.  Dry mouth.  Sore throat. How is this diagnosed? This condition may be diagnosed with:  A medical history.  A physical exam.  A series of tests that are done while you are sleeping (sleep study). These tests are usually done in a sleep lab, but they may also be done at home. How is this treated? Treatment for this condition aims to restore normal breathing and to ease symptoms during sleep. It may involve managing health issues that can affect breathing, such as high blood pressure or obesity. Treatment may include:  Sleeping on your side.  Using a decongestant if you have nasal congestion.  Avoiding the use of depressants, including alcohol, sedatives, and narcotics.  Losing weight if you are overweight.  Making changes to your diet.  Quitting smoking.  Using a device to open your airway while you sleep, such as: ? An oral appliance. This is a custom-made mouthpiece that shifts your lower jaw forward. ? A continuous positive airway pressure (CPAP) device. This device delivers oxygen to your airway through a mask. ? A nasal expiratory positive airway pressure (EPAP) device. This device has valves that you put into each nostril. ? A bi-level positive airway pressure (BPAP) device. This device delivers oxygen to your airway through a mask.  Surgery if other treatments do not work. During surgery, excess tissue is removed to create a wider airway. It is important to get treatment for sleep apnea. Without treatment, this condition can lead to:  High blood pressure.  Coronary artery disease.  (  Men) An inability to achieve or maintain an erection (impotence).  Reduced thinking abilities. Follow these instructions at home:  Make any lifestyle changes that your health care provider recommends.  Eat a healthy, well-balanced diet.  Take over-the-counter and prescription medicines only as told  by your health care provider.  Avoid using depressants, including alcohol, sedatives, and narcotics.  Take steps to lose weight if you are overweight.  If you were given a device to open your airway while you sleep, use it only as told by your health care provider.  Do not use any tobacco products, such as cigarettes, chewing tobacco, and e-cigarettes. If you need help quitting, ask your health care provider.  Keep all follow-up visits as told by your health care provider. This is important. Contact a health care provider if:  The device that you received to open your airway during sleep is uncomfortable or does not seem to be working.  Your symptoms do not improve.  Your symptoms get worse. Get help right away if:  You develop chest pain.  You develop shortness of breath.  You develop discomfort in your back, arms, or stomach.  You have trouble speaking.  You have weakness on one side of your body.  You have drooping in your face. These symptoms may represent a serious problem that is an emergency. Do not wait to see if the symptoms will go away. Get medical help right away. Call your local emergency services (911 in the [place] Do not drive yourself to the hospital. This information is not intended to replace advice given to you by your health care provider. Make sure you discuss any questions you have with your health care provider. Document Released: 03/26/2002 Document Revised: 11/01/2016 Document Reviewed: 01/13/2015 Elsevier Interactive Patient Education  2019 [employer].

## 2018-05-30 ENCOUNTER — Other Ambulatory Visit: Payer: Self-pay | Admitting: Pulmonary Disease

## 2018-05-30 DIAGNOSIS — G478 Other sleep disorders: Secondary | ICD-10-CM

## 2018-07-20 ENCOUNTER — Encounter (HOSPITAL_BASED_OUTPATIENT_CLINIC_OR_DEPARTMENT_OTHER): Payer: BLUE CROSS/BLUE SHIELD

## 2018-10-04 ENCOUNTER — Other Ambulatory Visit (HOSPITAL_COMMUNITY)
Admission: RE | Admit: 2018-10-04 | Discharge: 2018-10-04 | Disposition: A | Discharge status: Home / Self Care | Payer: BLUE CROSS/BLUE SHIELD | Source: Ambulatory Visit | Attending: Internal Medicine | Admitting: Internal Medicine

## 2018-10-04 DIAGNOSIS — Z1159 Encounter for screening for other viral diseases: Secondary | ICD-10-CM | POA: Insufficient documentation

## 2018-10-05 ENCOUNTER — Telehealth: Payer: Self-pay | Admitting: Pulmonary Disease

## 2018-10-05 LAB — NOVEL CORONAVIRUS, NAA (HOSP ORDER, SEND-OUT TO REF LAB; TAT 18-24 HRS): SARS-CoV-2, NAA: NOT DETECTED

## 2018-10-05 NOTE — Telephone Encounter (Signed)
Called and spoke with Patient.  Covid swab results given.  Understanding stated.  Nothing further at this time.

## 2018-10-07 ENCOUNTER — Other Ambulatory Visit: Payer: Self-pay

## 2018-10-07 ENCOUNTER — Ambulatory Visit (HOSPITAL_BASED_OUTPATIENT_CLINIC_OR_DEPARTMENT_OTHER): Payer: BLUE CROSS/BLUE SHIELD | Attending: Pulmonary Disease | Admitting: Pulmonary Disease

## 2018-10-07 DIAGNOSIS — G478 Other sleep disorders: Secondary | ICD-10-CM | POA: Diagnosis not present

## 2018-10-07 DIAGNOSIS — G4736 Sleep related hypoventilation in conditions classified elsewhere: Secondary | ICD-10-CM | POA: Insufficient documentation

## 2018-10-09 ENCOUNTER — Other Ambulatory Visit: Payer: Self-pay

## 2018-10-10 ENCOUNTER — Telehealth: Payer: Self-pay | Admitting: Pulmonary Disease

## 2018-10-10 NOTE — Telephone Encounter (Signed)
Spoke with patient. She was made aware of her results. She verbalized understanding.   Nothing further needed at time of call.

## 2018-10-10 NOTE — Telephone Encounter (Signed)
Sleep study result  Date of study 10/07/2018  Impression: Negative study for significant sleep disordered breathing Primary snoring  RECOMMENDATIONS - Avoid alcohol, sedatives and other CNS depressants that may worsen sleep apnea and disrupt normal sleep architecture. - Sleep hygiene should be reviewed to assess factors that may improve sleep quality. - Sleep position modification, discourage supine sleep. - Weight management and regular exercise should be initiated or continued.  Follow-up in the office as needed

## 2018-10-10 NOTE — Procedures (Signed)
POLYSOMNOGRAPHY  Last, First: Kaitlyn Walker, Kaitlyn Walker MRN: [id number] Gender: Female Age (years): 48 Weight (lbs): 220 DOB: Apr 03, 1971 BMI: 36 Primary Care: Raina Mina Epworth Score: 5 Referring: Laurin Coder MD Technician: Earney Hamburg Interpreting: Laurin Coder MD Study Type: NPSG Ordered Study Type: Split Night CPAP Study date: 10/07/2018 Location: [place] CLINICAL INFORMATION Kaitlyn Walker is a 48 year old Female and was referred to the sleep center for evaluation of N/A. Indications include OSA.  MEDICATIONS Patient self administered medications include: N/A. Medications administered during study include No sleep medicine administered.  SLEEP STUDY TECHNIQUE A multi-channel overnight Polysomnography study was performed. The channels recorded and monitored were central and occipital EEG, electrooculogram (EOG), submentalis EMG (chin), nasal and oral airflow, thoracic and abdominal wall motion, anterior tibialis EMG, snore microphone, electrocardiogram, and a pulse oximetry. TECHNICIAN COMMENTS Comments added by Technician: NONE Comments added by Scorer: N/A SLEEP ARCHITECTURE The study was initiated at 9:45:57 PM and terminated at 4:41:24 AM. The total recorded time was 415.4 minutes. EEG confirmed total sleep time was 309.6 minutes yielding a sleep efficiency of 74.5%%. Sleep onset after lights out was 44.3 minutes with a REM latency of 106.5 minutes. The patient spent 2.4%% of the night in stage N1 sleep, 75.5%% in stage N2 sleep, 13.9%% in stage N3 and 8.2% in REM. Wake after sleep onset (WASO) was 61.5 minutes. The Arousal Index was 6.6/hour. RESPIRATORY PARAMETERS There were a total of 2 respiratory disturbances out of which 2 were apneas ( 1 obstructive, 0 mixed, 1 central) and 0 hypopneas. The apnea/hypopnea index (AHI) was 0.4 events/hour. The central sleep apnea index was 0.2 events/hour. The REM AHI was 2.4 events/hour and NREM AHI was 0.2 events/hour. The supine AHI  was 1.2 events/hour and the non supine AHI was 0.2 supine during 15.52% of sleep. Respiratory disturbances were associated with oxygen desaturation down to a nadir of 92.0% during sleep. The mean oxygen saturation during the study was 95.6%. The cumulative time under 88% oxygen saturation was 5.5 minutes.  LEG MOVEMENT DATA The total leg movements were 0 with a resulting leg movement index of 0.0/hr .Associated arousal with leg movement index was 0.0/hr.  CARDIAC DATA The underlying cardiac rhythm was most consistent with sinus rhythm. Mean heart rate during sleep was 67.7 bpm. Additional rhythm abnormalities include None.   IMPRESSIONS - No Significant Obstructive Sleep apnea(OSA) - EKG showed no cardiac abnormalities. - No significant Oxygen Desaturation - The patient snored with loud snoring volume. - No significant periodic leg movements(PLMs) during sleep. No significant associated arousals.   DIAGNOSIS - Nocturnal Hypoxemia (327.26 [G47.36 ICD-10]) - Primary snoring   RECOMMENDATIONS - Avoid alcohol, sedatives and other CNS depressants that may worsen sleep apnea and disrupt normal sleep architecture. - Sleep hygiene should be reviewed to assess factors that may improve sleep quality. - Sleep position modification, discourage supine sleep. - Weight management and regular exercise should be initiated or continued.  [Electronically signed] 10/10/2018 04:30 AM  Sherrilyn Rist MD NPI: [id number]

## 2019-04-18 IMAGING — DX DG CHEST 2V
2 series · 2 of 2 positions shown · non-contrast
Comparison: Prior radiographs from 09/05/2017 as well as previous
studies dating back to 08/19/2017.

CLINICAL DATA: Follow-up examination for pneumonia.

EXAM:
CHEST - 2 VIEW

[chest pa]
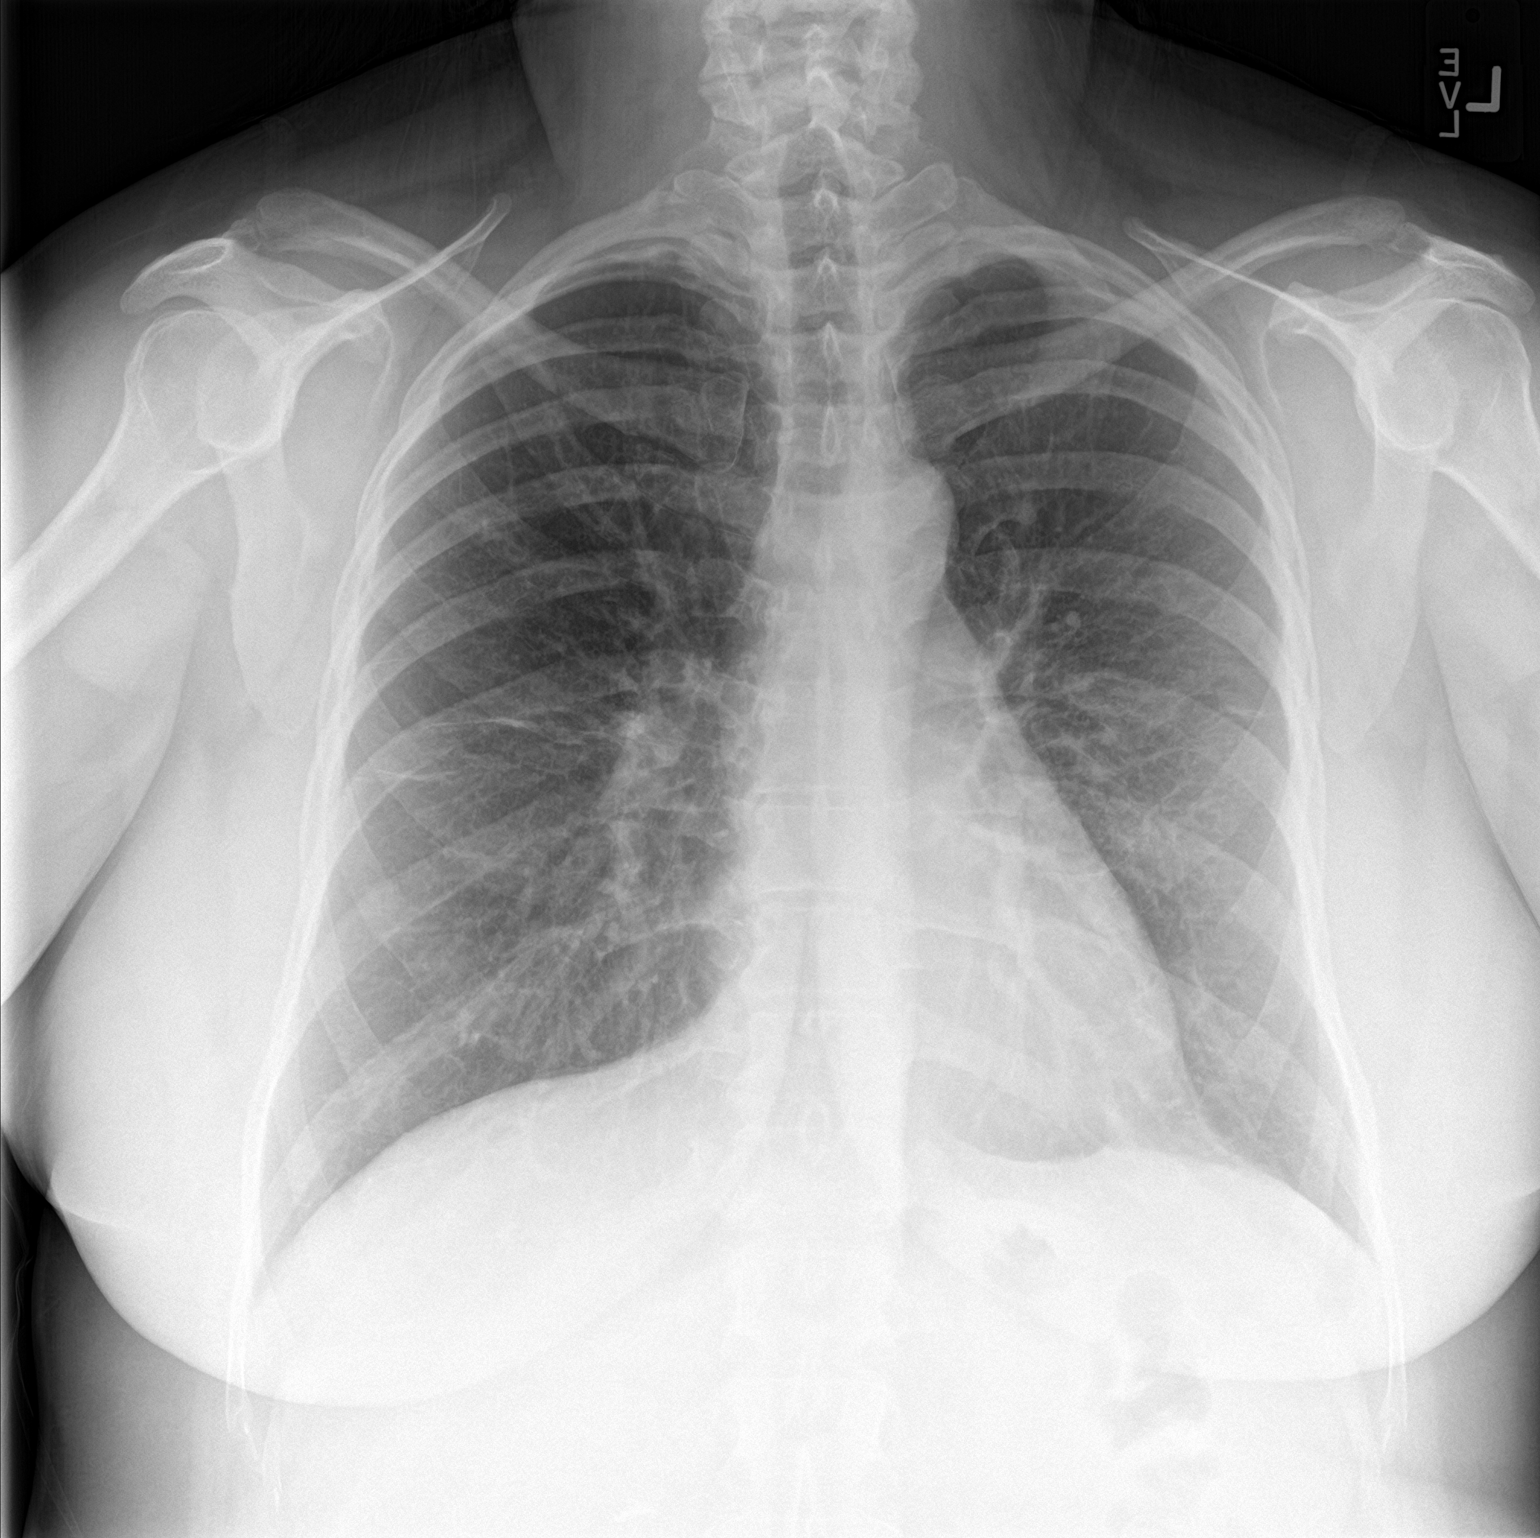

[chest lat]
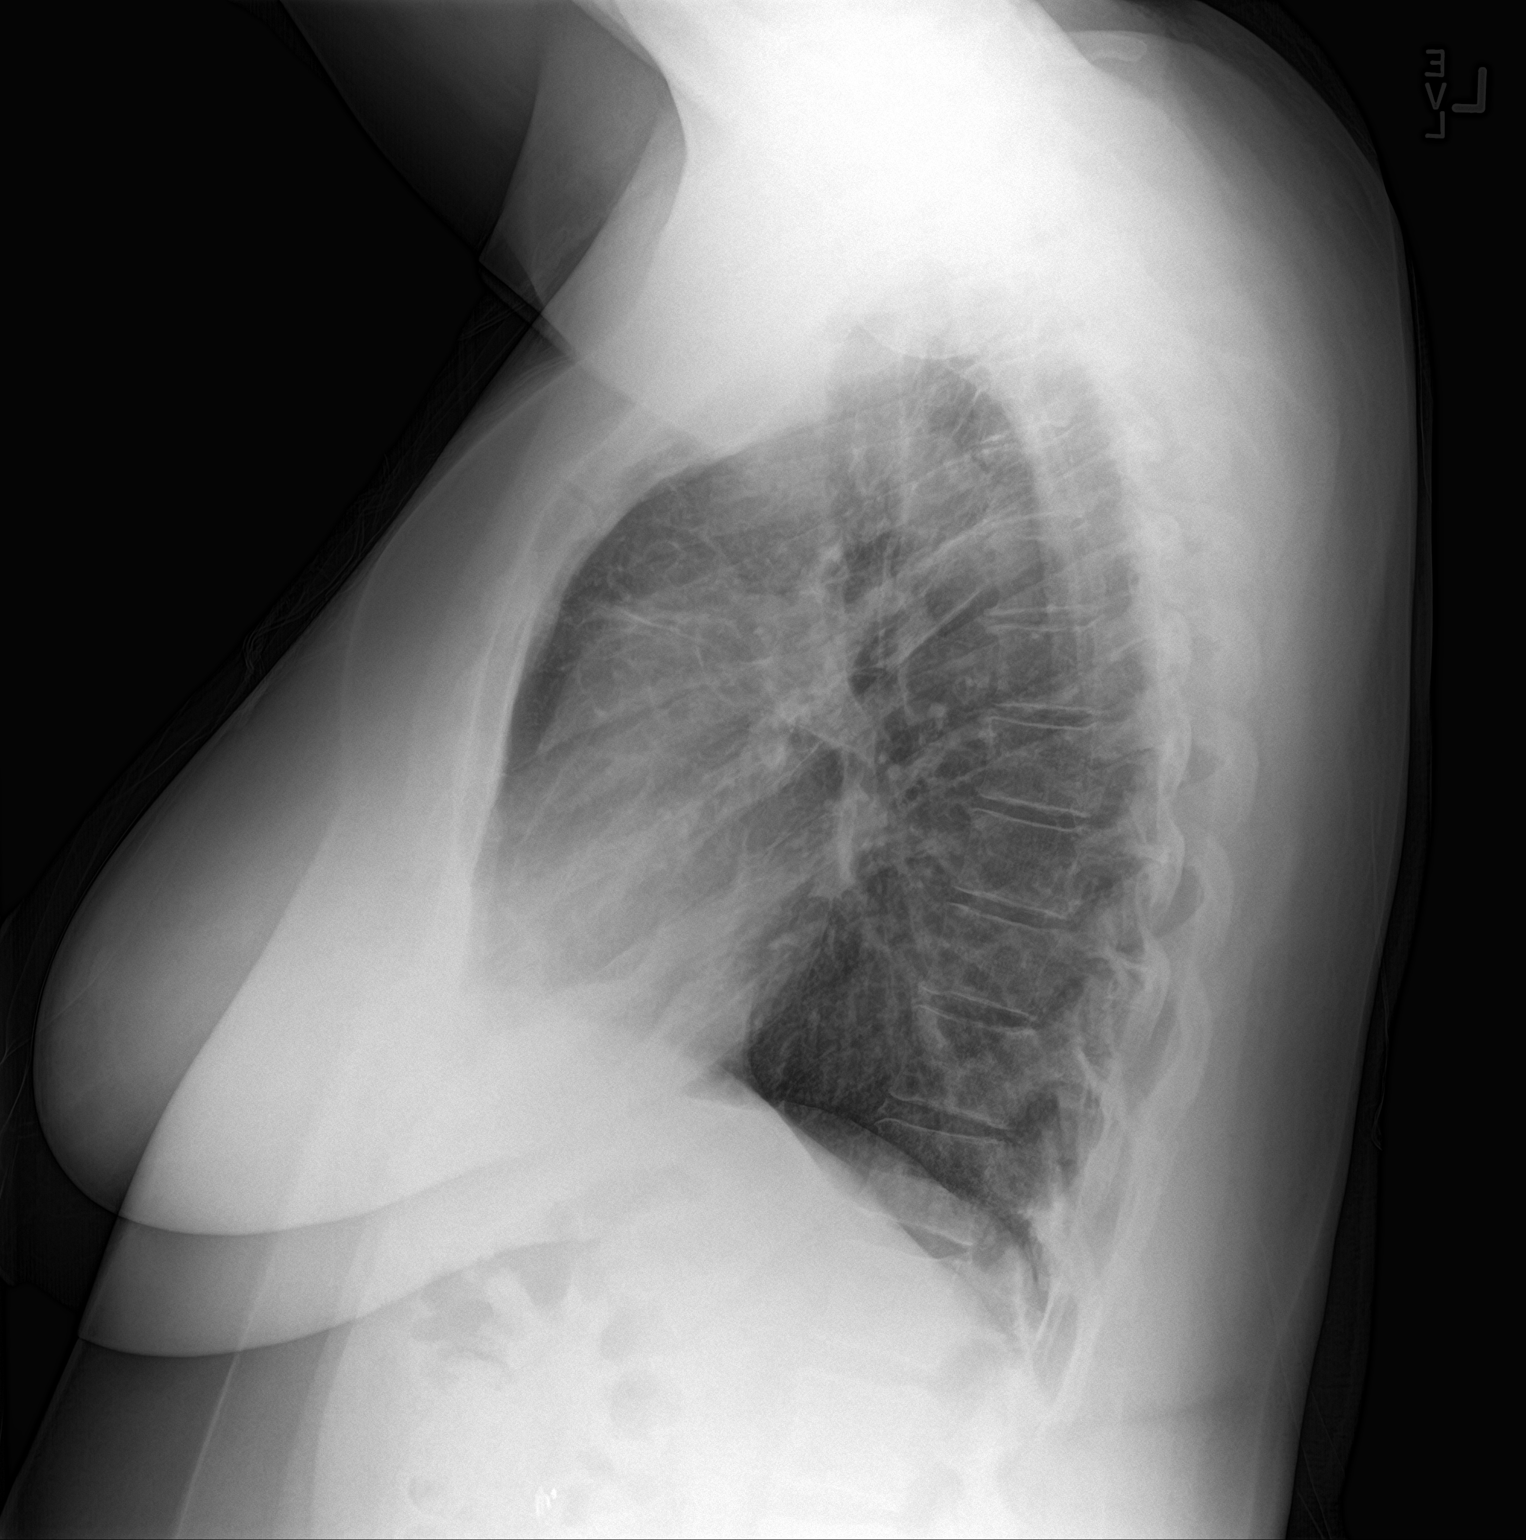

[2 of 2 positions shown; findings below may reference images not displayed]

FINDINGS: Cardiac and mediastinal silhouette stable in size and contour, and
remain within normal limits.

Lungs are normally inflated. Previously seen patchy infiltrates
involving the bilateral upper lobes have improved in the interim,
with only mild residual hazy infiltrate present within the left
upper lobe. Right lung opacities have essentially resolved.
Underlying bilateral perihilar scarring noted. No new focal
infiltrates. No pulmonary edema or pleural effusion. No
pneumothorax.

Osseous structures are unchanged.
IMPRESSION: 1. Continued interval improvement in previously seen bilateral upper
lobe airspace opacities, with only subtle residual opacity now seen
on the left.
2. No other new active cardiopulmonary disease.

## 2020-01-02 ENCOUNTER — Ambulatory Visit (INDEPENDENT_AMBULATORY_CARE_PROVIDER_SITE_OTHER): Payer: BC Managed Care – PPO | Admitting: Allergy and Immunology

## 2020-01-02 ENCOUNTER — Encounter: Payer: Self-pay | Admitting: Allergy and Immunology

## 2020-01-02 ENCOUNTER — Other Ambulatory Visit: Payer: Self-pay

## 2020-01-02 VITALS — BP 124/80 | HR 66 | Resp 16 | Ht 66.0 in | Wt 222.6 lb

## 2020-01-02 DIAGNOSIS — L500 Allergic urticaria: Secondary | ICD-10-CM | POA: Diagnosis not present

## 2020-01-02 DIAGNOSIS — L308 Other specified dermatitis: Secondary | ICD-10-CM | POA: Diagnosis not present

## 2020-01-02 DIAGNOSIS — J189 Pneumonia, unspecified organism: Secondary | ICD-10-CM | POA: Diagnosis not present

## 2020-01-02 DIAGNOSIS — L989 Disorder of the skin and subcutaneous tissue, unspecified: Secondary | ICD-10-CM

## 2020-01-02 MED ORDER — CETIRIZINE HCL 10 MG PO TABS: 10.0000 mg | ORAL_TABLET | Freq: Two times a day (BID) | ORAL | 5 refills | Status: AC

## 2020-01-02 MED ORDER — FAMOTIDINE 20 MG PO TABS: ORAL_TABLET | ORAL | 5 refills | Status: DC

## 2020-01-02 MED ORDER — MONTELUKAST SODIUM 10 MG PO TABS: 10.0000 mg | ORAL_TABLET | Freq: Every day | ORAL | 5 refills | Status: DC

## 2020-01-02 NOTE — Progress Notes (Signed)
Kaitlyn Walker - [place] - [place] - [place] - [place]   Dear Dr. Shary Decamp,  Thank you for referring Kaitlyn Walker to the [facility] [place] on 01/02/2020.   Below is a summation of this patient's evaluation and recommendations.  Thank you for your referral. I will keep you informed about this patient's response to treatment.   If you have any questions please do not hesitate to contact me.   Sincerely,  Jessica Priest, MD Allergy / Immunology [place] [facility]   ______________________________________________________________________    NEW PATIENT NOTE  Referring Provider: Gordan Payment., MD Primary Provider: Gordan Payment., MD Date of office visit: 01/02/2020    Subjective:   Chief Complaint:  Kaitlyn Walker (DOB: 1970/06/26) is a 49 y.o. female who presents to the clinic on 01/02/2020 with a chief complaint of Urticaria and Pruritus .     HPI: Kaitlyn Walker presents to this clinic in evaluation of dermatitis.  Apparently over the course of the past year she has been having red raised intensely itchy areas that she excoriates involving her legs with progression to her arms and chest.  She has been treated with multiple courses of systemic steroids including systemic steroid administration in January 2021, March 2021, May 2021, and most recently 6 weeks ago.  She does respond to the administration of systemic steroids but then relapses after these medications resolved.  Topical anti-inflammatory therapy did not appear to help this dermatitis.  As well she has seen dermatology and has received a biopsy of her skin lesion which identified a nonspecific finding.  She has no associated systemic or constitutional symptoms.  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  There is no obvious provoking factor giving rise to this issue.  She does not think that there is any correlation between starting a new medication, she does not use any over-the-counter supplements or health foods or herbs, and she has not really had a significant environmental change either at home or at work.  She does have a history of recurrent pneumonias.  Apparently since early in life she has been having recurrent pneumonias about every 5 years or so.  Her most recent pneumonia was 2-1/2 years ago which required a hospitalization.  She does not have any chronic respiratory tract symptoms.  Specifically, she can have cold air exposure without the development of any wheezing or coughing and she can exert herself without any problem and does not have a history of recurrent sinusitis.  She contracted Covid in January 2021 and she has not received any immunizations thereafter.  Past Medical History:  Diagnosis Date  . Pneumonia   . Pulmonary hypertension (HCC)   . Thyroid disease   . Urticaria     Past Surgical History:  Procedure Laterality Date  . ABDOMINAL HYSTERECTOMY    . TONSILLECTOMY      Allergies as of 01/02/2020      Reactions   Codeine Nausea Only      Medication List    fexofenadine 180 MG tablet Commonly known as: ALLEGRA Take 360 mg by mouth 2 (two) times daily.   Synthroid 175 MCG tablet Generic drug: levothyroxine Take 175 mcg by mouth daily.     Review of systems negative except as noted in HPI / PMHx or noted below:  Review of Systems  Constitutional: Negative.   HENT: Negative.   Eyes: Negative.   Respiratory: Negative.   Cardiovascular: Negative.   Gastrointestinal: Negative.   Genitourinary: Negative.   Musculoskeletal: Negative.   Skin: Negative.   Neurological: Negative.   Endo/Heme/Allergies: Negative.   Psychiatric/Behavioral: Negative.     Family History  Problem Relation Age of Onset  . Sleep apnea Mother   . Diabetes  Mother   . Heart attack Father     Social History   Socioeconomic History  . Marital status: Married    Spouse name: Not on file  . Number of children: Not on file  . Years of education: Not on file  . Highest education level: Not on file  Occupational History  . Not on file  Tobacco Use  . Smoking status: Former Smoker    Packs/day: 0.00    Years: 20.00    Pack years: 0.00    Types: Cigarettes    Quit date: 2009    Years since quitting: 12.7  . Smokeless tobacco: Never Used  Substance and Sexual Activity  . Alcohol use: No  . Drug use: No  . Sexual activity: Not on file  Other Topics Concern  . Not on file  Social History Narrative  . Not on file   Social Determinants of Health   Environmental and Social history  Lives in a house with a dry environment, dog look inside the household, no carpet in the bedroom, no plastic on the bed, no plastic on the pillow, no smoking ongoing with inside the household.  She works as an Environmental education officer.  Objective:   Vitals:   01/02/20 0905  BP: 124/80  Pulse: 66  Resp: 16  SpO2: 96%   Height: 5\' 6"  (167.6 cm) Weight: 222 lb 9.6 oz (101 kg)  Physical Exam Constitutional:      Appearance: She is not diaphoretic.  HENT:     Head: Normocephalic.     Right Ear: Tympanic membrane, ear canal and external ear normal.     Left Ear: Tympanic membrane, ear canal and external ear normal.     Nose: Nose normal. No mucosal edema or rhinorrhea.     Mouth/Throat:     Pharynx: Uvula midline. No oropharyngeal exudate.  Eyes:     Conjunctiva/sclera: Conjunctivae normal.  Neck:     Thyroid: No thyromegaly.     Trachea: Trachea normal. No tracheal tenderness or tracheal deviation.  Cardiovascular:     Rate and Rhythm: Normal rate and regular rhythm.     Heart sounds: Normal heart sounds, S1 normal and S2 normal. No murmur heard.   Pulmonary:     Effort: No respiratory distress.     Breath sounds: Normal breath  sounds. No stridor. No wheezing or rales.  Lymphadenopathy:     Head:     Right side of head: No tonsillar adenopathy.     Left side of head: No tonsillar adenopathy.     Cervical: No cervical adenopathy.  Skin:    Findings: No erythema or rash.     Nails: There is no clubbing.  Neurological:     Mental Status: She is alert.     Diagnostics: Allergy skin tests were performed.  She did not demonstrate any hypersensitivity to a screening panel of aeroallergens or foods.  Results of blood tests obtained 19 September 2017 identified IgG 1015 NG/DL, IgM 63 NG/DL, IgA 21 September 2017 mg/DL, C3 161 mg/DL, C4 29 mg/DL, WBC  10.3, absolute eosinophil 300, absolute lymphocyte 2300, hemoglobin 12.6, platelet 240  Results of blood tests obtained 10 Sep 2019 identified thyroid peroxidase 0.7U/mL, free T4 0.9 NG/DL, TSH 2.956 IU/mL,  Results of blood tests obtained 03 October 2018 identifies basophilia at 200, AST 12 U/L, ALT 7U/L  Review of the skin biopsy obtained 12 Sep 2019 identified perivascular inflammatory infiltrate of lymphocytes, histiocytes, and scattered eosinophils in the dermis.  Assessment and Plan:    1. Allergic urticaria   2. Inflammatory dermatosis   3. Recurrent pneumonia     1.  Allergen avoidance measures?  2.  Consistently use a preventative plan:   A. Montelukast 10 mg - 1 tablet 1 time per day  B. Famotidine 20 mg - 1 tablet 2 times per day  C. Cetirizine 10 mg - 1 tablet 2 times per day  3. Omalizumab?  4. Obtain blood - CBC w/d, IgE, anti-pneumo ab, anti-tetanus ab  5. Obtain fall flu vaccine and Covid vaccine  6. Return to clinic in 8 weeks or earlier if problem  I have given Jamayah a preventative plan to address her immunological hyperreactivity manifested as urticaria with possible component of inflammatory dermatosis.  We will see how this works over the course of the next 8 weeks.  If she fails this form of therapy she would be a candidate for omalizumab initially and then  possibly dupilumab.  She has a history of recurrent infections and we will screen her blood for a immune defect.  I will contact her with the results of these blood test once they are available for review.  Jessica Priest, MD Allergy / Immunology [facility] [place]

## 2020-01-02 NOTE — Patient Instructions (Addendum)
  1.  Allergen avoidance measures?  2.  Consistently use a preventative plan:   A. Montelukast 10 mg - 1 tablet 1 time per day  B. Famotidine 20 mg - 1 tablet 2 times per day  C. Cetirizine 10 mg - 1 tablet 2 times per day  3. Omalizumab?  4. Obtain blood - CBC w/d, IgE, anti-pneumo ab, anti-tetanus ab  5. Obtain fall flu vaccine and Covid vaccine  6. Return to clinic in 8 weeks or earlier if problem

## 2020-01-03 ENCOUNTER — Encounter: Payer: Self-pay | Admitting: Allergy and Immunology

## 2020-01-04 ENCOUNTER — Encounter: Payer: Self-pay | Admitting: *Deleted

## 2020-01-11 LAB — CBC WITH DIFFERENTIAL
Basophils Absolute: 0 10*3/uL (ref 0.0–0.2)
Basos: 0 %
EOS (ABSOLUTE): 0.1 10*3/uL (ref 0.0–0.4)
Eos: 1 %
Hematocrit: 37.4 % (ref 34.0–46.6)
Hemoglobin: 12.1 g/dL (ref 11.1–15.9)
Immature Grans (Abs): 0 10*3/uL (ref 0.0–0.1)
Immature Granulocytes: 0 %
Lymphocytes Absolute: 2.2 10*3/uL (ref 0.7–3.1)
Lymphs: 27 %
MCH: 27.9 pg (ref 26.6–33.0)
MCHC: 32.4 g/dL (ref 31.5–35.7)
MCV: 86 fL (ref 79–97)
Monocytes Absolute: 0.9 10*3/uL (ref 0.1–0.9)
Monocytes: 11 %
Neutrophils Absolute: 4.9 10*3/uL (ref 1.4–7.0)
Neutrophils: 61 %
RBC: 4.34 x10E6/uL (ref 3.77–5.28)
RDW: 15.1 % (ref 11.7–15.4)
WBC: 8.1 10*3/uL (ref 3.4–10.8)

## 2020-01-11 LAB — STREP PNEUMONIAE 23 SEROTYPES IGG
Pneumo Ab Type 1*: 4.3 ug/mL (ref >1.3)
Pneumo Ab Type 12 (12F)*: 0.6 ug/mL — ABNORMAL LOW (ref >1.3)
Pneumo Ab Type 14*: >30.6 ug/mL (ref >1.3)
Pneumo Ab Type 17 (17F)*: 2.3 ug/mL (ref >1.3)
Pneumo Ab Type 19 (19F)*: 12.2 ug/mL (ref >1.3)
Pneumo Ab Type 2*: 3.3 ug/mL (ref >1.3)
Pneumo Ab Type 20*: 6.1 ug/mL (ref >1.3)
Pneumo Ab Type 22 (22F)*: 1.1 ug/mL — ABNORMAL LOW (ref >1.3)
Pneumo Ab Type 23 (23F)*: 2 ug/mL (ref >1.3)
Pneumo Ab Type 26 (6B)*: 7.4 ug/mL (ref >1.3)
Pneumo Ab Type 3*: 8.7 ug/mL (ref >1.3)
Pneumo Ab Type 34 (10A)*: 9.1 ug/mL (ref >1.3)
Pneumo Ab Type 4*: 1 ug/mL — ABNORMAL LOW (ref >1.3)
Pneumo Ab Type 43 (11A)*: 1.8 ug/mL (ref >1.3)
Pneumo Ab Type 5*: 12.4 ug/mL (ref >1.3)
Pneumo Ab Type 51 (7F)*: 0.6 ug/mL — ABNORMAL LOW (ref >1.3)
Pneumo Ab Type 54 (15B)*: 2 ug/mL (ref >1.3)
Pneumo Ab Type 56 (18C)*: 1.5 ug/mL (ref >1.3)
Pneumo Ab Type 57 (19A)*: 5.6 ug/mL (ref >1.3)
Pneumo Ab Type 68 (9V)*: 4.3 ug/mL (ref >1.3)
Pneumo Ab Type 70 (33F)*: 1.5 ug/mL (ref >1.3)
Pneumo Ab Type 8*: 2.1 ug/mL (ref >1.3)
Pneumo Ab Type 9 (9N)*: 7.6 ug/mL (ref >1.3)

## 2020-01-11 LAB — IGE: IgE (Immunoglobulin E), Serum: 26 IU/mL (ref 6–495)

## 2020-01-11 LAB — TETANUS ANTIBODY, IGG: Tetanus Ab, IgG: 0.74 IU/mL (ref <0.10)

## 2020-02-21 ENCOUNTER — Ambulatory Visit: Payer: BC Managed Care – PPO | Admitting: Allergy and Immunology

## 2020-03-17 ENCOUNTER — Ambulatory Visit: Payer: BC Managed Care – PPO | Admitting: Allergy and Immunology

## 2020-03-17 ENCOUNTER — Other Ambulatory Visit: Payer: Self-pay

## 2020-03-17 ENCOUNTER — Encounter: Payer: Self-pay | Admitting: Allergy and Immunology

## 2020-03-17 VITALS — BP 130/82 | HR 72 | Resp 12

## 2020-03-17 DIAGNOSIS — L500 Allergic urticaria: Secondary | ICD-10-CM

## 2020-03-17 DIAGNOSIS — J189 Pneumonia, unspecified organism: Secondary | ICD-10-CM

## 2020-03-17 MED ORDER — EPINEPHRINE 0.3 MG/0.3ML IJ SOAJ: INTRAMUSCULAR | 3 refills | Status: AC

## 2020-03-17 MED ORDER — OMALIZUMAB 150 MG/ML ~~LOC~~ SOSY
300.0000 mg | PREFILLED_SYRINGE | Freq: Once | SUBCUTANEOUS | Status: AC
  Administered 2020-03-17: 300 mg via SUBCUTANEOUS

## 2020-03-17 NOTE — Progress Notes (Signed)
[place] - [place] - [place] - [place] - [place]   Follow-up Note  Referring Provider: Gordan Payment., MD Primary Provider: Gordan Payment., MD Date of Office Visit: 03/17/2020  Subjective:   Kaitlyn Walker (DOB: 04-Sep-1970) is a 49 y.o. female who returns to the [facility] on 03/17/2020 in re-evaluation of the following:  HPI: Alixis returns to this clinic in reevaluation of recurrent urticaria and a history of inflammatory dermatosis and a history of recurrent pneumonia.  Her last visit to this clinic was her initial evaluation of 02 January 2020.  Regarding her urticaria, she thought that she was doing relatively well regarding control of this skin condition when using a combination of an H1 and H2 receptor blocker along with montelukast.  Her skin condition was tolerable but unfortunately she had a big breakthrough over the course of the past week without any obvious trigger and no associated systemic or constitutional symptoms.  She has itchiness across her extremities with whelps.  Allergies as of 03/17/2020      Reactions   Codeine Nausea Only   Levofloxacin Rash      Medication List    cetirizine 10 MG tablet Commonly known as: ZYRTEC Take 1 tablet (10 mg total) by mouth 2 (two) times daily.   famotidine 20 MG tablet Commonly known as: PEPCID Take 1 tablet by mouth twice daily   montelukast 10 MG tablet Commonly known as: SINGULAIR Take 1 tablet (10 mg total) by mouth at bedtime.   Nurtec 75 MG Tbdp Generic drug: Rimegepant Sulfate Take by mouth.   Synthroid 175 MCG tablet Generic drug: levothyroxine Take 175 mcg by mouth daily.       Past Medical History:  Diagnosis Date  . Pneumonia   . Pulmonary hypertension (HCC)   . Thyroid disease   . Urticaria     Past Surgical History:  Procedure Laterality Date  . ABDOMINAL HYSTERECTOMY    . TONSILLECTOMY      Review of systems negative except as noted in HPI / PMHx or noted  below:  Review of Systems  Constitutional: Negative.   HENT: Negative.   Eyes: Negative.   Respiratory: Negative.   Cardiovascular: Negative.   Gastrointestinal: Negative.   Genitourinary: Negative.   Musculoskeletal: Negative.   Skin: Negative.   Neurological: Negative.   Endo/Heme/Allergies: Negative.   Psychiatric/Behavioral: Negative.      Objective:   Vitals:   03/17/20 1633  BP: 130/82  Pulse: 72  Resp: 12  SpO2: 98%          Physical Exam Skin:    Findings: Rash (Diffuse blanching urticaria and redness extremities bilaterally with evidence of excoriation.) present.     Diagnostics: Results of blood tests obtained 03 January 2020 identified WBC 8.1, absolute eosinophil 100, absolute lymphocyte 2200, hemoglobin 12.1, IgE 26 KU/L, adequate antipneumococcal antibody titers directed against multiple serotypes of pneumococcus, tetanus IgG 0.74U/mL which is protective.  Assessment and Plan:   1. Allergic urticaria   2. Recurrent pneumonia     1.  Consistently use a preventative plan:   A. Montelukast 10 mg - 1 tablet 1 time per day  B. Famotidine 20 mg - 1 tablet 2 times per day  C. Cetirizine 10 mg - 1 tablet 2 times per day  3.  Start omalizumab 300 mg injection every 4 weeks  4.  Start prednisone 10 mg -1 tablet 1 time per day for 10 days only  5.  Return to  clinic in 6 months or earlier if problem  Kaitlyn Walker appears to have a significantly activated immune system in the form of chronic idiopathic urticaria and inflammatory dermatosis and we will start her on omalizumab as she has failed medical therapy regarding control of this issue.  Assuming she does well with the plan noted above I will see her back in his clinic in 6 months or earlier if there is a problem.  Her blood test for an immune deficit are negative and at this point in time there is no further evaluation indicated for her history of "recurrent pneumonia" unless of course this becomes a active  issue.  Laurette Schimke, MD Allergy / Immunology [facility]

## 2020-03-17 NOTE — Progress Notes (Signed)
Patient started samples of Xolair 300mg  for urticaria.  I reviewed the signs and symptoms of an allergic reaction and how and when to use the Epipen.  Sent prescription for generic Epipen to pharmacy.  Patient has signed the consent form.  Patient waited approximately 30 minutes and did not have any noted reactions.

## 2020-03-17 NOTE — Patient Instructions (Addendum)
  1.  Consistently use a preventative plan:   A. Montelukast 10 mg - 1 tablet 1 time per day  B. Famotidine 20 mg - 1 tablet 2 times per day  C. Cetirizine 10 mg - 1 tablet 2 times per day  3.  Start omalizumab 300 mg injection every 4 weeks  4.  Start prednisone 10 mg -1 tablet 1 time per day for 10 days only  5.  Return to clinic in 6 months or earlier if problem

## 2020-03-18 ENCOUNTER — Encounter: Payer: Self-pay | Admitting: Allergy and Immunology

## 2020-03-19 ENCOUNTER — Telehealth: Payer: Self-pay | Admitting: *Deleted

## 2020-03-19 NOTE — Telephone Encounter (Signed)
T/C from patient, she did not have her [employer] info so I had to call her local pharmacy to get same.  I did advise process of approval, copay card and submit to [facility]

## 2020-03-19 NOTE — Telephone Encounter (Signed)
L/m for patient to contact me and have [employer] card info when she calls

## 2020-04-10 ENCOUNTER — Other Ambulatory Visit: Payer: Self-pay

## 2020-04-10 ENCOUNTER — Ambulatory Visit (INDEPENDENT_AMBULATORY_CARE_PROVIDER_SITE_OTHER): Payer: BC Managed Care – PPO | Admitting: *Deleted

## 2020-04-10 DIAGNOSIS — L501 Idiopathic urticaria: Secondary | ICD-10-CM

## 2020-04-10 MED ORDER — OMALIZUMAB 150 MG ~~LOC~~ SOLR
300.0000 mg | SUBCUTANEOUS | Status: AC
  Administered 2020-04-10 – 2020-07-03 (×4): 300 mg via SUBCUTANEOUS

## 2020-05-08 ENCOUNTER — Ambulatory Visit (INDEPENDENT_AMBULATORY_CARE_PROVIDER_SITE_OTHER): Payer: BC Managed Care – PPO | Admitting: *Deleted

## 2020-05-08 ENCOUNTER — Other Ambulatory Visit: Payer: Self-pay

## 2020-05-08 DIAGNOSIS — L501 Idiopathic urticaria: Secondary | ICD-10-CM | POA: Diagnosis not present

## 2020-06-05 ENCOUNTER — Ambulatory Visit (INDEPENDENT_AMBULATORY_CARE_PROVIDER_SITE_OTHER): Payer: BC Managed Care – PPO | Admitting: *Deleted

## 2020-06-05 ENCOUNTER — Other Ambulatory Visit: Payer: Self-pay

## 2020-06-05 DIAGNOSIS — L501 Idiopathic urticaria: Secondary | ICD-10-CM

## 2020-07-03 ENCOUNTER — Ambulatory Visit (INDEPENDENT_AMBULATORY_CARE_PROVIDER_SITE_OTHER): Payer: BC Managed Care – PPO | Admitting: *Deleted

## 2020-07-03 ENCOUNTER — Other Ambulatory Visit: Payer: Self-pay

## 2020-07-03 DIAGNOSIS — L501 Idiopathic urticaria: Secondary | ICD-10-CM

## 2020-07-17 ENCOUNTER — Other Ambulatory Visit: Payer: Self-pay | Admitting: Allergy and Immunology

## 2020-07-27 ENCOUNTER — Other Ambulatory Visit: Payer: Self-pay | Admitting: Allergy and Immunology

## 2020-07-31 ENCOUNTER — Ambulatory Visit: Payer: BC Managed Care – PPO

## 2020-09-11 ENCOUNTER — Ambulatory Visit: Payer: Self-pay | Admitting: Allergy and Immunology
